# Patient Record
Sex: Male | Born: 1980 | Hispanic: Yes | Smoking: Never smoker
Health system: Southern US, Community
[De-identification: ages and names within clinical notes are randomized; demographics above are authoritative.]

## PROBLEM LIST (undated history)

## (undated) DIAGNOSIS — E7800 Pure hypercholesterolemia, unspecified: Secondary | ICD-10-CM

## (undated) DIAGNOSIS — R202 Paresthesia of skin: Secondary | ICD-10-CM

## (undated) DIAGNOSIS — R200 Anesthesia of skin: Secondary | ICD-10-CM

## (undated) HISTORY — DX: Anesthesia of skin: R20.0

## (undated) HISTORY — DX: Pure hypercholesterolemia, unspecified: E78.00

## (undated) HISTORY — DX: Paresthesia of skin: R20.2

## (undated) HISTORY — PX: OTHER SURGICAL HISTORY: SHX169

---

## 2014-03-29 ENCOUNTER — Ambulatory Visit
Admission: RE | Admit: 2014-03-29 | Discharge: 2014-03-29 | Disposition: A | Discharge status: Home / Self Care | Payer: No Typology Code available for payment source | Source: Ambulatory Visit | Attending: General Practice | Admitting: General Practice

## 2014-03-29 ENCOUNTER — Other Ambulatory Visit: Payer: Self-pay | Admitting: General Practice

## 2014-03-29 DIAGNOSIS — R609 Edema, unspecified: Secondary | ICD-10-CM

## 2015-03-30 IMAGING — US US EXTREM LOW VENOUS*R*
1 series · 13 of 24 positions shown · non-contrast
Comparison: None.

CLINICAL DATA: Right lower extremity edema.



[Series 1: us extrem low venous*right* · 13 of 31 slices shown]
[im 1/31]
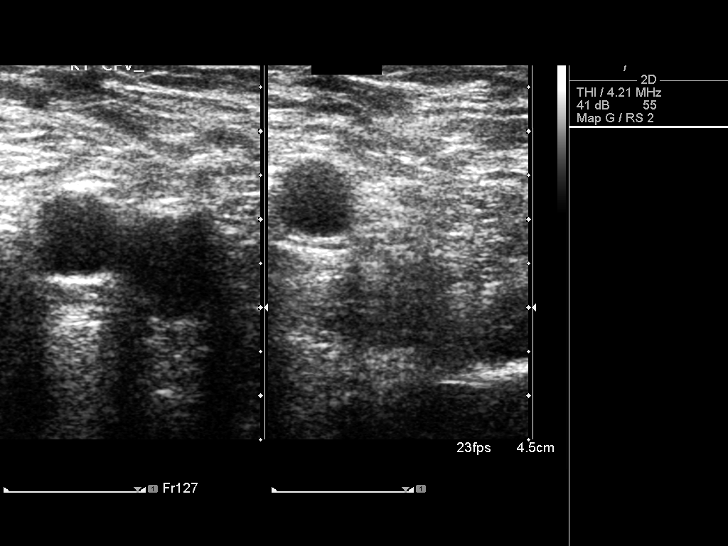
[im 3/31]
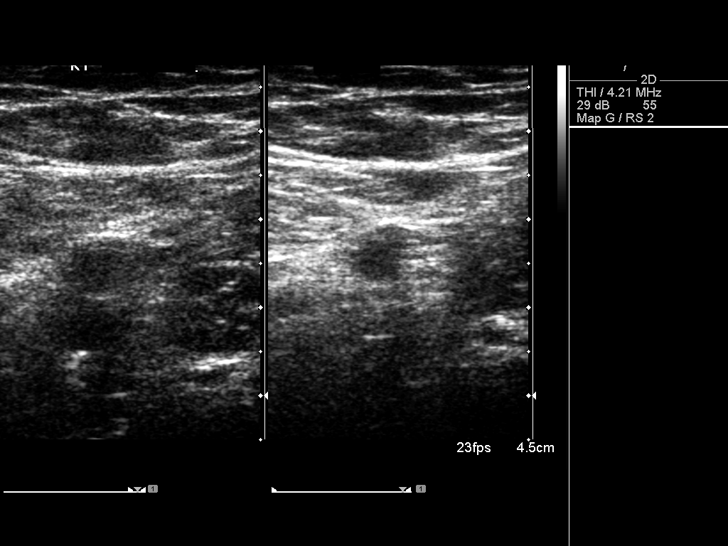
[im 6/31]
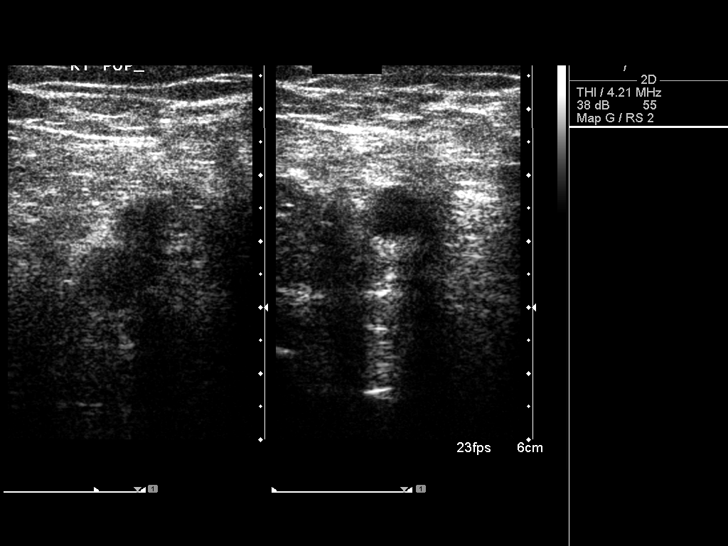
[im 8/31]
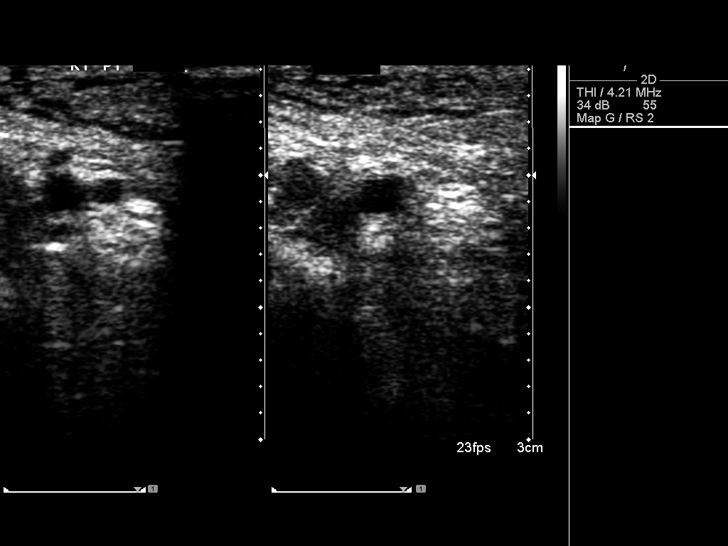
[im 11/31]
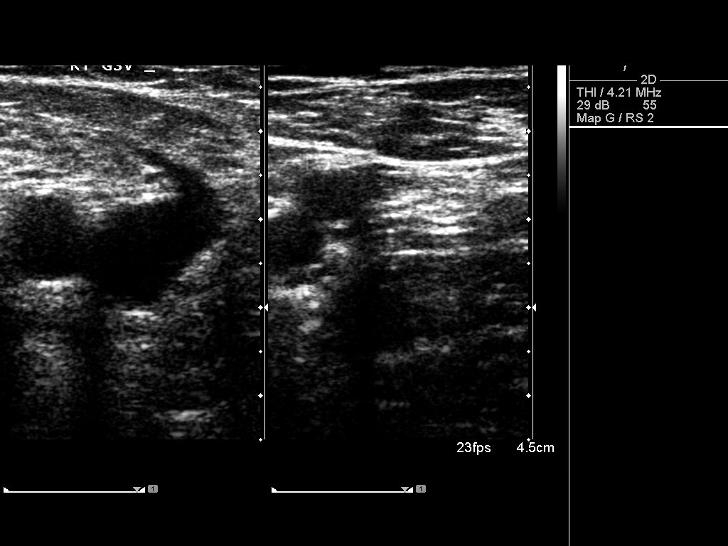
[im 14/31]
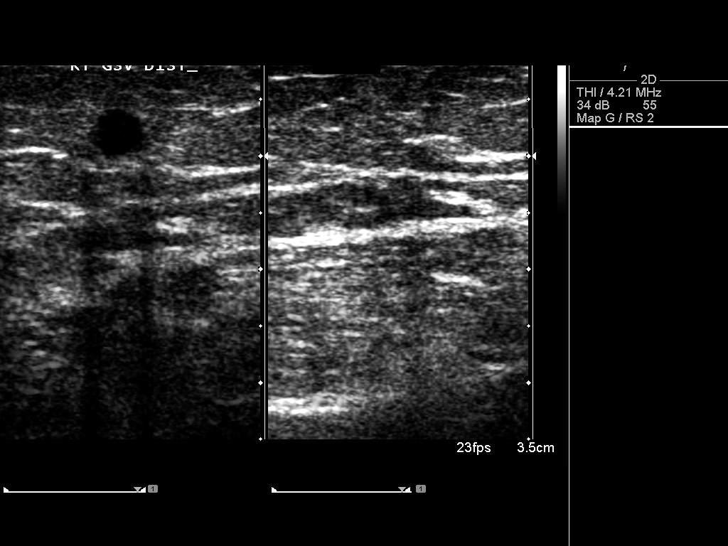
[im 16/31]
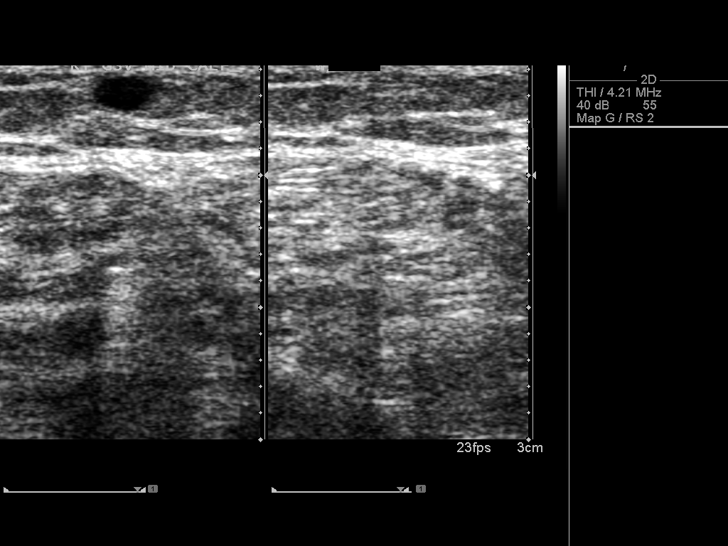
[im 17/31]
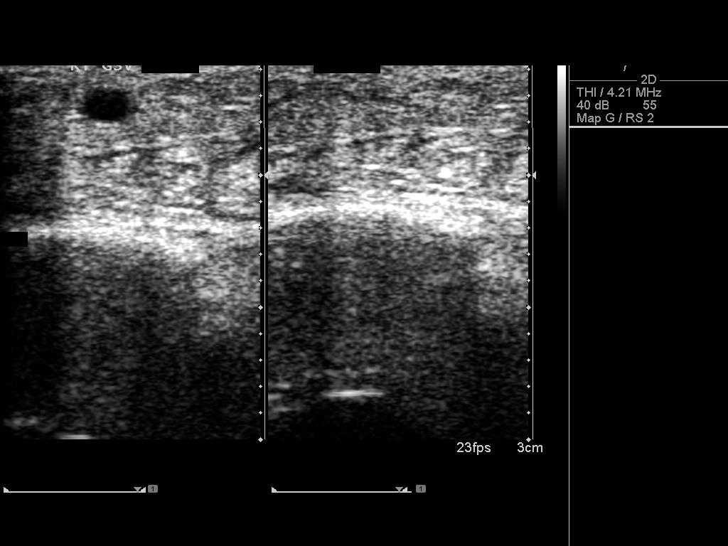
[im 20/31]
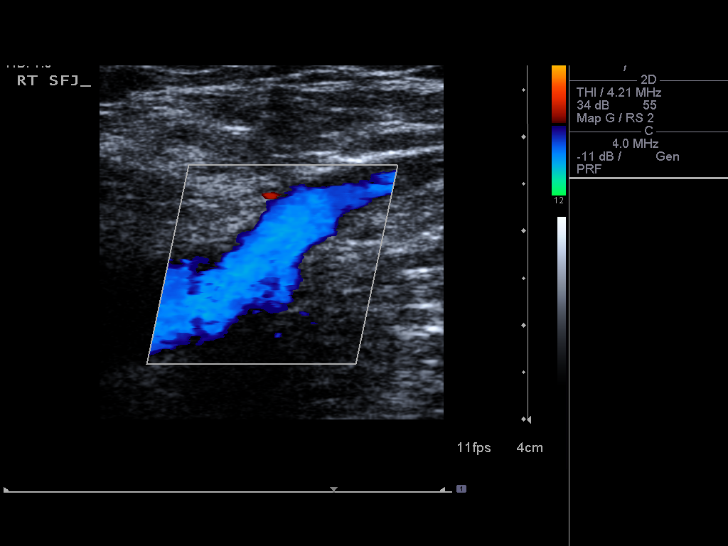
[im 23/31]
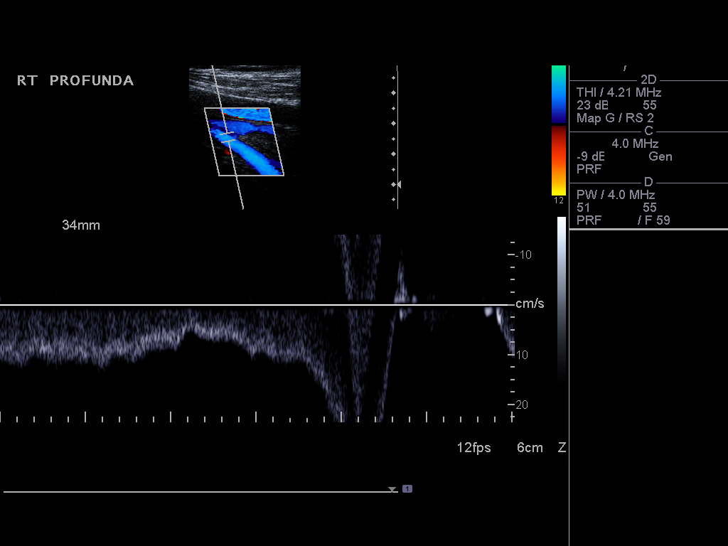
[im 25/31]
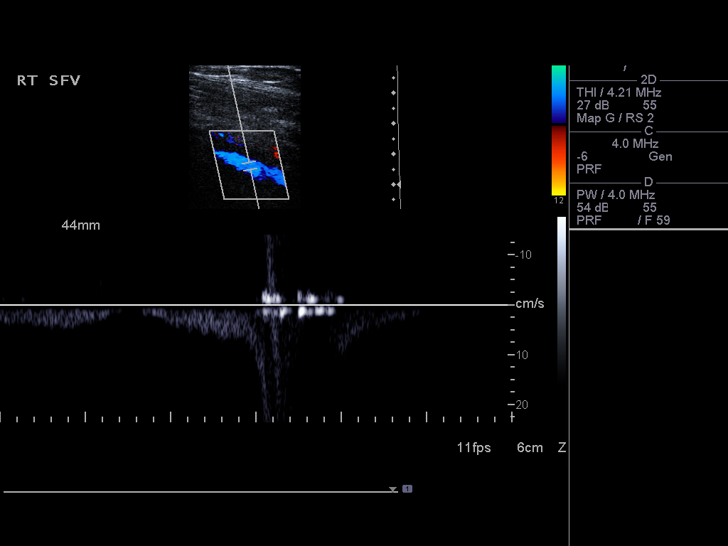
[im 28/31]
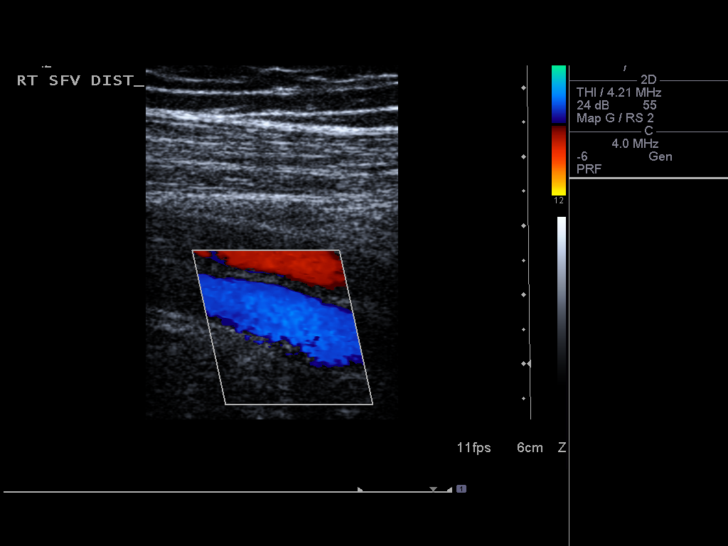
[im 31/31]
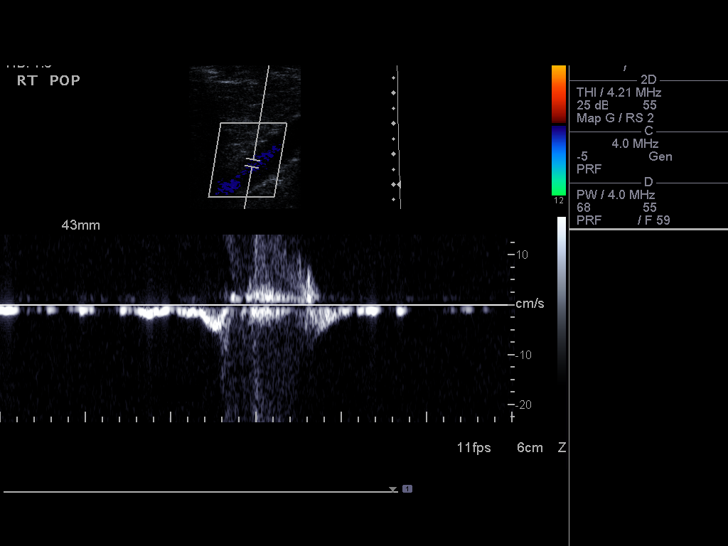

[13 of 24 positions shown; findings below may reference images not displayed]

FINDINGS: Common Femoral Vein: No evidence of thrombus. Normal
compressibility, respiratory phasicity and response to augmentation.

Saphenofemoral Junction: No evidence of thrombus. Normal
compressibility and flow on color Doppler imaging.

Profunda Femoral Vein: No evidence of thrombus. Normal
compressibility and flow on color Doppler imaging.

Femoral Vein: No evidence of thrombus. Normal compressibility,
respiratory phasicity and response to augmentation.

Popliteal Vein: No evidence of thrombus. Normal compressibility,
respiratory phasicity and response to augmentation.

Calf Veins: No evidence of thrombus. Normal compressibility and flow
on color Doppler imaging.

Superficial Great Saphenous Vein: No evidence of thrombus. Normal
compressibility and flow on color Doppler imaging.

Venous Reflux:  None.

Other Findings: No evidence of superficial thrombophlebitis or
abnormal fluid collection.
IMPRESSION: No evidence of right lower extremity deep venous thrombosis.

## 2018-10-21 ENCOUNTER — Ambulatory Visit: Payer: 59 | Admitting: Neurology

## 2018-12-09 ENCOUNTER — Ambulatory Visit: Payer: 59 | Admitting: Neurology

## 2019-01-07 ENCOUNTER — Ambulatory Visit: Payer: Self-pay | Admitting: Neurology

## 2019-02-02 ENCOUNTER — Other Ambulatory Visit: Payer: Self-pay

## 2019-02-02 ENCOUNTER — Ambulatory Visit: Payer: 59 | Admitting: Neurology

## 2019-02-02 ENCOUNTER — Telehealth: Payer: Self-pay | Admitting: Neurology

## 2019-02-02 ENCOUNTER — Encounter: Payer: Self-pay | Admitting: Neurology

## 2019-02-02 VITALS — BP 125/89 | HR 72 | Temp 98.6°F | Ht 74.0 in | Wt 240.5 lb

## 2019-02-02 DIAGNOSIS — R202 Paresthesia of skin: Secondary | ICD-10-CM

## 2019-02-02 DIAGNOSIS — M542 Cervicalgia: Secondary | ICD-10-CM | POA: Diagnosis not present

## 2019-02-02 MED ORDER — NORTRIPTYLINE HCL 25 MG PO CAPS: 50.0000 mg | ORAL_CAPSULE | Freq: Every day | ORAL | 11 refills | Status: AC

## 2019-02-02 NOTE — Progress Notes (Signed)
PATIENT: Nicholas Fitzgerald DOB: 12-23-1980  Chief Complaint  Patient presents with  . Numbness    Reports numbness/tingling in fingers and toes.  He has never tried medications for his symptoms.    . Cervical Discomfort    For the last 1-2 months, he feels like his muscles are being pulled.  He has pain radiating into his head.  He has additional discomfort when turning his head side to side.   Marland Kitchen PCP    Kendall Flack, MD     HISTORICAL  Nicholas Fitzgerald is a 38 years old male, seen in request by his primary care physician Dr. Dorena Cookey, Rosemarie Ax for evaluation of bilateral upper and lower extremity paresthesia, neck pain, initial evaluation was on February 02, 2019, he is Hispanic speaking, history is through interpreter.  I have reviewed and summarized the referring note from the referring physician.  He was previously healthy, worked as a Building surveyor, in 2019, without clear trigger, he began to notice bilateral fingertips and toes paresthesia, involving both side, there is no limitation in his daily activity, but gradually getting worse, he noticed it 50% of his awake time, he denies gait abnormality, no bowel and bladder incontinence, he also noticed the neck pain, radiating to bilateral shoulder and along his spine, he complains of fatigue, difficulty sleep, stressed   REVIEW OF SYSTEMS: Full 14 system review of systems performed and notable only for as above All other review of systems were negative.  ALLERGIES: No Known Allergies  HOME MEDICATIONS: No current outpatient medications on file.   No current facility-administered medications for this visit.     PAST MEDICAL HISTORY: Past Medical History:  Diagnosis Date  . High cholesterol   . Numbness and tingling    fingers and toes    PAST SURGICAL HISTORY: Past Surgical History:  Procedure Laterality Date  . no past surgery      FAMILY HISTORY: Family History  Problem Relation Age of Onset  . Healthy  Mother   . Pneumonia Father     SOCIAL HISTORY: Social History   Socioeconomic History  . Marital status: Legally Separated    Spouse name: Not on file  . Number of children: 2  . Years of education: 6th grade  . Highest education level: Not on file  Occupational History  . Occupation: Charity fundraiser  . Financial resource strain: Not on file  . Food insecurity    Worry: Not on file    Inability: Not on file  . Transportation needs    Medical: Not on file    Non-medical: Not on file  Tobacco Use  . Smoking status: Never Smoker  . Smokeless tobacco: Never Used  Substance and Sexual Activity  . Alcohol use: Yes    Comment: socially  . Drug use: Never  . Sexual activity: Not on file  Lifestyle  . Physical activity    Days per week: Not on file    Minutes per session: Not on file  . Stress: Not on file  Relationships  . Social Herbalist on phone: Not on file    Gets together: Not on file    Attends religious service: Not on file    Active member of club or organization: Not on file    Attends meetings of clubs or organizations: Not on file    Relationship status: Not on file  . Intimate partner violence    Fear of current or  ex partner: Not on file    Emotionally abused: Not on file    Physically abused: Not on file    Forced sexual activity: Not on file  Other Topics Concern  . Not on file  Social History Narrative   Lives at home with a roommate.   Right-handed.   Occasional coffee.          PHYSICAL EXAM   Vitals:   02/02/19 0746  BP: 125/89  Pulse: 72  Temp: 98.6 F (37 C)  Weight: 240 lb 8 oz (109.1 kg)  Height: 6\' 2"  (1.88 m)    Not recorded      Body mass index is 30.88 kg/m.  PHYSICAL EXAMNIATION:  Gen: NAD, conversant, well nourised, obese, well groomed                     Cardiovascular: Regular rate rhythm, no peripheral edema, warm, nontender. Eyes: Conjunctivae clear without exudates or hemorrhage Neck:  Supple, no carotid bruits. Pulmonary: Clear to auscultation bilaterally   NEUROLOGICAL EXAM:  MENTAL STATUS: Speech:    Speech is normal; fluent and spontaneous with normal comprehension.  Cognition:     Orientation to time, place and person     Normal recent and remote memory     Normal Attention span and concentration     Normal Language, naming, repeating,spontaneous speech     Fund of knowledge   CRANIAL NERVES: CN II: Visual fields are full to confrontation. Fundoscopic exam is normal with sharp discs and no vascular changes. Pupils are round equal and briskly reactive to light. CN III, IV, VI: extraocular movement are normal. No ptosis. CN V: Facial sensation is intact to pinprick in all 3 divisions bilaterally. Corneal responses are intact.  CN VII: Face is symmetric with normal eye closure and smile. CN VIII: Hearing is normal to rubbing fingers CN IX, X: Palate elevates symmetrically. Phonation is normal. CN XI: Head turning and shoulder shrug are intact CN XII: Tongue is midline with normal movements and no atrophy.  MOTOR: There is no pronator drift of out-stretched arms. Muscle bulk and tone are normal. Muscle strength is normal.  REFLEXES: Reflexes are 2+ and symmetric at the biceps, triceps, knees, and ankles. Plantar responses are flexor.  SENSORY: Intact to light touch, pinprick, positional sensation and vibratory sensation are intact in fingers and toes.  COORDINATION: Rapid alternating movements and fine finger movements are intact. There is no dysmetria on finger-to-nose and heel-knee-shin.    GAIT/STANCE: Posture is normal. Gait is steady with normal steps, base, arm swing, and turning. Heel and toe walking are normal. Tandem gait is normal.  Romberg is absent.   DIAGNOSTIC DATA (LABS, IMAGING, TESTING) - I reviewed patient records, labs, notes, testing and imaging myself where available.   ASSESSMENT AND PLAN  Nicholas Fitzgerald is a 38 y.o. male     Neck pain Bilateral upper and lower extremity paresthesia   MRI of cervical spine to rule out cervical spondylitic disease  EMG nerve conductions study to rule out peripheral neuropathy  Laboratory evaluations including A1c  Nortriptyline 25 mg titrating to 50 mg every night  Levert FeinsteinYijun Sokhna Christoph, M.D. Ph.D.  [facility] [address], [address] [place], [place] [address] Ph: [phone] Fax: [phone]  CC: Purvis SheffieldMercado, Claudia G, MD

## 2019-02-02 NOTE — Telephone Encounter (Signed)
[employer] order sent to GI. They will obtain the auth and reach out to the patient to schedule.  

## 2019-02-03 ENCOUNTER — Telehealth: Payer: Self-pay | Admitting: *Deleted

## 2019-02-03 LAB — COMPREHENSIVE METABOLIC PANEL
ALT: 32 IU/L (ref 0–44)
AST: 18 IU/L (ref 0–40)
Albumin/Globulin Ratio: 1.7 (ref 1.2–2.2)
Albumin: 4.7 g/dL (ref 4.0–5.0)
Alkaline Phosphatase: 67 IU/L (ref 39–117)
BUN/Creatinine Ratio: 9 (ref 9–20)
BUN: 10 mg/dL (ref 6–24)
Bilirubin Total: 0.4 mg/dL (ref 0.0–1.2)
CO2: 24 mmol/L (ref 20–29)
Calcium: 9.8 mg/dL (ref 8.7–10.2)
Chloride: 106 mmol/L (ref 96–106)
Creatinine, Ser: 1.16 mg/dL (ref 0.76–1.27)
GFR calc Af Amer: 91 mL/min/{1.73_m2} (ref >59)
GFR calc non Af Amer: 78 mL/min/{1.73_m2} (ref >59)
Globulin, Total: 2.7 g/dL (ref 1.5–4.5)
Glucose: 99 mg/dL (ref 65–99)
Potassium: 4.6 mmol/L (ref 3.5–5.2)
Sodium: 141 mmol/L (ref 134–144)
Total Protein: 7.4 g/dL (ref 6.0–8.5)

## 2019-02-03 LAB — CBC WITH DIFFERENTIAL
Basophils Absolute: 0 10*3/uL (ref 0.0–0.2)
Basos: 1 %
EOS (ABSOLUTE): 0.1 10*3/uL (ref 0.0–0.4)
Eos: 2 %
Hematocrit: 44 % (ref 37.5–51.0)
Hemoglobin: 15.5 g/dL (ref 13.0–17.7)
Immature Grans (Abs): 0 10*3/uL (ref 0.0–0.1)
Immature Granulocytes: 1 %
Lymphocytes Absolute: 2.1 10*3/uL (ref 0.7–3.1)
Lymphs: 36 %
MCH: 30.8 pg (ref 26.6–33.0)
MCHC: 35.2 g/dL (ref 31.5–35.7)
MCV: 87 fL (ref 79–97)
Monocytes Absolute: 0.4 10*3/uL (ref 0.1–0.9)
Monocytes: 6 %
Neutrophils Absolute: 3.2 10*3/uL (ref 1.4–7.0)
Neutrophils: 54 %
RBC: 5.04 x10E6/uL (ref 4.14–5.80)
RDW: 12.6 % (ref 11.6–15.4)
WBC: 5.8 10*3/uL (ref 3.4–10.8)

## 2019-02-03 LAB — VITAMIN B12: Vitamin B-12: 637 pg/mL (ref 232–1245)

## 2019-02-03 LAB — ANA W/REFLEX IF POSITIVE: Anti Nuclear Antibody (ANA): NEGATIVE

## 2019-02-03 LAB — HEMOGLOBIN A1C
Est. average glucose Bld gHb Est-mCnc: 114 mg/dL
Hgb A1c MFr Bld: 5.6 % (ref 4.8–5.6)

## 2019-02-03 LAB — TSH: TSH: 2.87 u[IU]/mL (ref 0.450–4.500)

## 2019-02-03 LAB — RPR: RPR Ser Ql: NONREACTIVE

## 2019-02-03 NOTE — Telephone Encounter (Signed)
-----   Message from Marcial Pacas, MD sent at 02/03/2019  2:42 PM EDT ----- Please call patient for normal laboratory result

## 2019-02-03 NOTE — Telephone Encounter (Signed)
I have called the patient by using the language line [phone]) and he has been notified of his lab results.

## 2019-03-04 ENCOUNTER — Other Ambulatory Visit: Payer: Self-pay

## 2019-03-08 NOTE — Telephone Encounter (Signed)
He is on schedule for EMG/NCS on August 24th 2020.  Please advise him to take over-counter Aleve as needed, if he remains significantly symptomatic, I can write gabapentin 300 mg 3 times a day for symptoms control, may even refer him to physical therapy,  His insurance will only approve MRI of cervical spine if failed above conservative treatment,

## 2019-03-08 NOTE — Telephone Encounter (Signed)
[employer] did not approve the MRI Cervical.  Denial Reason:   Based on [employer] Sp[employer] Imaging Guidelines Section: SP 3.1 Neck (Cervical Spine) Pain without and with Neurological Features (Including Stenosis), we cannot approve this request. Your records show that you have neck pain. The reason this request cannot be approved is because: 1. Guidelines may support imaging in the evaluation of suspected or known spinal disease with one of the following. -Failure to improve after a recent (within three months) six week trial of physician-guided clinical care with clinical re-evaluation. -Any signs or symptoms such as significant motor weakness, malignancy, infection, cauda equina syndrome, for which conservative treatment is not needed. The clinical information received fails to support meeting these requirements and, therefore, the request is not indicated at this time.   There is an option to do a peer to peer. The phone number is [phone] and the case number is [id number]. It does have to be scheduled in order to do the peer to peer. The deadline to do the peer to peer is Monday 03/22/19.. Right now he is scheduled at [facility] for Friday 03/12/19.

## 2019-03-09 NOTE — Telephone Encounter (Signed)
I called the patient by using the language line [phone]).  He is aware of the information below.  He would like to try Aleve and keep his NCV/EMG appt on 03/22/2019.  He may consider PT at that time.

## 2019-03-12 ENCOUNTER — Other Ambulatory Visit: Payer: Self-pay

## 2019-03-22 ENCOUNTER — Ambulatory Visit (INDEPENDENT_AMBULATORY_CARE_PROVIDER_SITE_OTHER): Payer: 59 | Admitting: Neurology

## 2019-03-22 ENCOUNTER — Other Ambulatory Visit: Payer: Self-pay

## 2019-03-22 ENCOUNTER — Encounter: Payer: 59 | Admitting: Neurology

## 2019-03-22 DIAGNOSIS — Z0289 Encounter for other administrative examinations: Secondary | ICD-10-CM

## 2019-03-22 DIAGNOSIS — R202 Paresthesia of skin: Secondary | ICD-10-CM | POA: Diagnosis not present

## 2019-03-22 DIAGNOSIS — M542 Cervicalgia: Secondary | ICD-10-CM | POA: Diagnosis not present

## 2019-03-22 NOTE — Procedures (Signed)
Full Name: Nicholas Fitzgerald Gender: Male MRN #: [id number] Date of Birth: Dec 13, 1980    Visit Date: 03/22/2019 13:33 Age: [age] Years 1 Months Old Examining Physician: Levert FeinsteinYijun Diona Peregoy, MD  Referring Physician: Levert FeinsteinYijun Walta [place], MD History: 38 year old male presented with intermittent bilateral upper and lower extremity paresthesia  Summary of the tests:  Nerve conduction study:  Bilateral sural, superficial peroneal sensory responses were normal.  Right median, ulnar sensory responses were normal.  Bilateral tibial, right peroneal to EDB, right median and ulnar motor responses were normal  Electromyography:  Selective needle examinations of bilateral lower extremity muscles showed evidence of widespread increased insertional activity, with mild chronic neuropathic changes involving bilateral L4-5 S1 myotomes.  There is no evidence of lumbar paraspinal muscle spontaneous discharge  Selected needle examination of the right upper extremity muscles were normal.   Conclusion:  This is a slight abnormal study.  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   ------------------------------- Levert FeinsteinYIjun Plato Alspaugh, M.D. PhD [facility] [address] [place], [place] [address] Tel: [phone] Fax: [phone]        [facility]    Nerve / Sites Muscle Latency Ref. Amplitude Ref. Rel Amp Segments Distance Velocity Ref. Area    ms ms mV mV %  cm m/s m/s mVms  R Median - APB     Wrist APB 3.9 ?4.4 7.6 ?4.0 100 Wrist - APB 7   27.8     Upper arm APB 8.2  8.0  105 Upper arm - Wrist 25 58 ?49 29.3  R Ulnar - ADM     Wrist ADM 2.4 ?3.3 9.9 ?6.0 100 Wrist - ADM 7   33.4     B.Elbow ADM 6.1  8.4  84.9 B.Elbow - Wrist 22 60 ?49 28.7     A.Elbow ADM 7.8  8.1  96.5 A.Elbow - B.Elbow 10 58 ?49 27.8         A.Elbow - Wrist      R Peroneal - EDB   Ankle EDB 5.5 ?6.5 5.0 ?2.0 100 Ankle - EDB 9   18.5     Fib head EDB 12.4  4.5  89.5 Fib head - Ankle 34 49 ?44 17.7     Pop fossa EDB 14.6  4.3  96.8 Pop fossa - Fib head 10 46 ?44 17.4         Pop fossa - Ankle      R Tibial - AH     Ankle AH 4.0 ?5.8 13.1 ?4.0 100 Ankle - AH 9   34.8     Pop fossa AH 13.1  10.8  82.2 Pop fossa - Ankle 40 44 ?41 30.6  L Tibial - AH     Ankle AH 4.4 ?5.8 9.5 ?4.0 100 Ankle - AH 9   29.6     Pop fossa AH 13.5  7.5  78 Pop fossa - Ankle 38 42 ?41 26.8               SNC    Nerve / Sites Rec. Site Peak Lat Ref.  Amp Ref. Segments Distance    ms ms V V  cm  R Sural - Ankle (Calf)     Calf Ankle 3.8 ?4.4 15 ?6 Calf - Ankle 14  L Sural - Ankle (Calf)     Calf Ankle 3.6 ?4.4 14 ?6 Calf -  Ankle 14  R Superficial peroneal - Ankle     Lat leg Ankle 4.0 ?4.4 7 ?6 Lat leg - Ankle 14  L Superficial peroneal - Ankle     Lat leg Ankle 4.2 ?4.4 9 ?6 Lat leg - Ankle 14  R Median - Orthodromic (Dig II, Mid palm)     Dig II Wrist 3.0 ?3.4 13 ?10 Dig II - Wrist 13  R Ulnar - Orthodromic, (Dig V, Mid palm)     Dig V Wrist 2.5 ?3.1 7 ?5 Dig V - Wrist 69                  F  Wave    Nerve F Lat Ref.   ms ms  R Tibial - AH 52.4 ?56.0  R Ulnar - ADM 28.6 ?32.0  L Tibial - AH 53.1 ?56.0           EMG       EMG Summary Table    Spontaneous MUAP Recruitment  Muscle IA Fib PSW Fasc Other Amp Dur. Poly Pattern  R. Tibialis anterior Increased None None None CRDs Normal Normal Normal Reduced  R. Tibialis posterior Increased None None None _______ Normal Normal Normal Reduced  R. Peroneus longus Increased None None None _______ Normal Normal Normal Reduced  R. Gastrocnemius (Medial head) Increased None None None _______ Normal Normal Normal Reduced  R. Vastus lateralis Increased None None None _______ Normal Normal Normal Reduced  R. Biceps femoris (short head) Increased None None None _______ Normal Normal Normal Reduced  R. Biceps femoris (long head) Increased  None None None _______ Normal Normal Normal Reduced  R. Lumbar paraspinals (mid) Normal None None None _______ Normal Normal Normal Normal  R. Lumbar paraspinals (low) Normal None None None _______ Normal Normal Normal Normal  L. Tibialis posterior Increased None None None _______ Normal Normal Normal Reduced  L. Tibialis anterior Increased None None None _______ Normal Normal Normal Reduced  L. Peroneus longus Increased None None None _______ Normal Normal Normal Reduced  L. Gastrocnemius (Medial head) Increased None None None _______ Normal Normal Normal Reduced  L. Vastus lateralis Increased None None None _______ Normal Normal Normal Reduced  L. Biceps femoris (short head) Normal None None None _______ Normal Normal Normal Normal  L. Lumbar paraspinals (mid) Normal None None None _______ Normal Normal Normal Normal  L. Lumbar paraspinals (low) Normal None None None _______ Normal Normal Normal Normal  R. First dorsal interosseous Normal None None None _______ Normal Normal Normal Normal  R. Pronator teres Normal None None None _______ Normal Normal Normal Normal  R. Biceps brachii Normal None None None _______ Normal Normal Normal Normal  R. Deltoid Normal None None None _______ Normal Normal Normal Normal  R. Triceps brachii Normal None None None _______ Normal Normal Normal Normal  R. Extensor digitorum communis Normal None None None _______ Normal Normal Normal Normal  R. Cervical paraspinals Normal None None None _______ Normal Normal Normal Normal

## 2019-03-23 ENCOUNTER — Telehealth: Payer: Self-pay | Admitting: Neurology

## 2019-03-23 NOTE — Telephone Encounter (Signed)
[employer] order sent to GI. They will obtain the auth and reach out to the patient to schedule.  

## 2019-06-21 ENCOUNTER — Telehealth: Payer: Self-pay | Admitting: *Deleted

## 2019-06-21 NOTE — Telephone Encounter (Signed)
I was unable to reach the patient.  I called the patient's friend on DPR, Nicholas Fitzgerald (through our interpreter services dept).  He had a pending appt on 06/28/2019 w/ Dr. Krista Blue but has not had his MRI yet.  Dr. Krista Blue requested the follow up be rescheduled until after his scan.  He was recently diagnosed with COVID-19 and has not been cleared by PCP yet.  He will call to schedule scan at [facility] and then our office to r/s follow up, once he is safe/recovered.  Per Dr. Krista Blue, it is okay for him to see NP.

## 2019-06-22 ENCOUNTER — Ambulatory Visit: Payer: Self-pay | Admitting: Neurology

## 2019-06-28 ENCOUNTER — Ambulatory Visit: Payer: Self-pay | Admitting: Neurology

## 2023-03-05 ENCOUNTER — Emergency Department (HOSPITAL_COMMUNITY): Payer: 59

## 2023-03-05 ENCOUNTER — Emergency Department (HOSPITAL_COMMUNITY)
Admission: EM | Admit: 2023-03-05 | Discharge: 2023-03-06 | Disposition: A | Discharge status: Home / Self Care | Payer: 59 | Attending: Emergency Medicine | Admitting: Emergency Medicine

## 2023-03-05 ENCOUNTER — Other Ambulatory Visit: Payer: Self-pay

## 2023-03-05 ENCOUNTER — Encounter (HOSPITAL_COMMUNITY): Payer: Self-pay | Admitting: Emergency Medicine

## 2023-03-05 DIAGNOSIS — A084 Viral intestinal infection, unspecified: Secondary | ICD-10-CM | POA: Insufficient documentation

## 2023-03-05 DIAGNOSIS — R197 Diarrhea, unspecified: Secondary | ICD-10-CM

## 2023-03-05 DIAGNOSIS — R1013 Epigastric pain: Secondary | ICD-10-CM | POA: Diagnosis present

## 2023-03-05 LAB — URINALYSIS, ROUTINE W REFLEX MICROSCOPIC
Bacteria, UA: NONE SEEN
Bilirubin Urine: NEGATIVE
Glucose, UA: NEGATIVE mg/dL
Hgb urine dipstick: NEGATIVE
Ketones, ur: NEGATIVE mg/dL
Leukocytes,Ua: NEGATIVE
Nitrite: NEGATIVE
Protein, ur: NEGATIVE mg/dL
Specific Gravity, Urine: 1.024 (ref 1.005–1.030)
pH: 5 (ref 5.0–8.0)

## 2023-03-05 LAB — CBC
HCT: 44.6 % (ref 39.0–52.0)
Hemoglobin: 14.9 g/dL (ref 13.0–17.0)
MCH: 29.7 pg (ref 26.0–34.0)
MCHC: 33.4 g/dL (ref 30.0–36.0)
MCV: 89 fL (ref 80.0–100.0)
Platelets: 293 10*3/uL (ref 150–400)
RBC: 5.01 MIL/uL (ref 4.22–5.81)
RDW: 12.6 % (ref 11.5–15.5)
WBC: 6.1 10*3/uL (ref 4.0–10.5)
nRBC: 0 % (ref 0.0–0.2)

## 2023-03-05 LAB — COMPREHENSIVE METABOLIC PANEL
ALT: 31 U/L (ref 0–44)
AST: 28 U/L (ref 15–41)
Albumin: 4.2 g/dL (ref 3.5–5.0)
Alkaline Phosphatase: 58 U/L (ref 38–126)
Anion gap: 8 (ref 5–15)
BUN: 13 mg/dL (ref 6–20)
CO2: 25 mmol/L (ref 22–32)
Calcium: 8.8 mg/dL — ABNORMAL LOW (ref 8.9–10.3)
Chloride: 105 mmol/L (ref 98–111)
Creatinine, Ser: 1.35 mg/dL — ABNORMAL HIGH (ref 0.61–1.24)
GFR, Estimated: >60 mL/min (ref >60)
Glucose, Bld: 91 mg/dL (ref 70–99)
Potassium: 3.8 mmol/L (ref 3.5–5.1)
Sodium: 138 mmol/L (ref 135–145)
Total Bilirubin: 0.8 mg/dL (ref 0.3–1.2)
Total Protein: 7.2 g/dL (ref 6.5–8.1)

## 2023-03-05 LAB — LIPASE, BLOOD: Lipase: 36 U/L (ref 11–51)

## 2023-03-05 MED ORDER — OMEPRAZOLE 20 MG PO CPDR: 20.0000 mg | DELAYED_RELEASE_CAPSULE | Freq: Every day | ORAL | 0 refills | Status: AC

## 2023-03-05 NOTE — Discharge Instructions (Addendum)
1.  You should be seen by a family doctor.  2. Follow instructions for diarrhea. 3. Collect a stool specimen and return it to the [facility] lab. 4. Take omeprazole daily for the next 2 weeks. 5.  You have an area on your kidney that is likely a cyst.  You should follow-up and have this rechecked with your family doctor.  There is also a small area on the front of your liver that is probably a hemangioma, a cluster of blood vessels.  This usually is not a serious condition.  However it is important that these findings are monitored.  You have been given a resource guide that includes follow-up recommendations.

## 2023-03-05 NOTE — ED Triage Notes (Signed)
Pt arrived POV with reports of diarrhea and abdominal pain x 2 weeks after eating shrimp. Denies fevers.

## 2023-03-05 NOTE — ED Provider Notes (Signed)
[facility] AT [facility] Provider Note   CSN: [id number] Arrival date & time: 03/05/23  1726     History  Chief Complaint  Patient presents with   Abdominal Pain    Nicholas Fitzgerald is a 42 y.o. male.  HPI Patient reports diarrhea for about 2 weeks.  He ate ceviche about 2 weeks ago.  His wife ate a little bit of but not much.  He subsequently ended up having both vomiting and diarrhea.  Initial the diarrhea was very dark and frequent.  It ended up getting more clear and watery.  He reports he thought things were getting better temporarily but then symptoms rebounded again.  He has been having persistent waxing and waning severity epigastric pain.  Worse when he went back to a regular diet the pain and diarrhea worsened again.  He denies fevers or chills.  Not seeing any blood in the emesis or stool.  Although stool was very dark initially.  Denies other medical problems.  He does not drink alcohol.  No frequent use of NSAIDs.  He was seen in urgent care and treated with ciprofloxacin but reports he had no improvement.    Home Medications Prior to Admission medications   Medication Sig Start Date End Date Taking? Authorizing Provider  omeprazole (PRILOSEC) 20 MG capsule Take 1 capsule (20 mg total) by mouth daily. 03/05/23  Yes Arby Barrette, MD  nortriptyline (PAMELOR) 25 MG capsule Take 2 capsules (50 mg total) by mouth at bedtime. 02/02/19   Levert Feinstein, MD      Allergies    Patient has no known allergies.    Review of Systems   Review of Systems  Physical Exam Updated Vital Signs BP 101/77 (BP Location: Left Arm)   Pulse 62   Temp 97.9 F (36.6 C) (Oral)   Resp 18   SpO2 97%  Physical Exam Constitutional:      Comments: Well-nourished well-developed.  Nontoxic.  No respiratory distress.  HENT:     Head: Normocephalic and atraumatic.     Mouth/Throat:     Pharynx: Oropharynx is clear.  Eyes:     Extraocular Movements: Extraocular  movements intact.  Cardiovascular:     Rate and Rhythm: Normal rate and regular rhythm.  Pulmonary:     Effort: Pulmonary effort is normal.     Breath sounds: Normal breath sounds.  Abdominal:     Comments: Moderate epigastric pain to palpation.  No guarding.  Lower abdomen nontender.  Musculoskeletal:        General: No swelling or tenderness. Normal range of motion.     Right lower leg: No edema.     Left lower leg: No edema.  Skin:    General: Skin is warm and dry.  Neurological:     General: No focal deficit present.     Mental Status: He is oriented to person, place, and time.     Coordination: Coordination normal.  Psychiatric:        Mood and Affect: Mood normal.     ED Results / Procedures / Treatments   Labs (all labs ordered are listed, but only abnormal results are displayed) Labs Reviewed  COMPREHENSIVE METABOLIC PANEL - Abnormal; Notable for the following components:      Result Value   Creatinine, Ser 1.35 (*)    Calcium 8.8 (*)    All other components within normal limits  LIPASE, BLOOD  CBC  URINALYSIS, ROUTINE W REFLEX MICROSCOPIC  EKG None  Radiology CT ABDOMEN PELVIS WO CONTRAST  Result Date: 03/05/2023 CLINICAL DATA:  Abdominal pain. EXAM: CT ABDOMEN AND PELVIS WITHOUT CONTRAST TECHNIQUE: Multidetector CT imaging of the abdomen and pelvis was performed following the standard protocol without IV contrast. RADIATION DOSE REDUCTION: This exam was performed according to the departmental dose-optimization program which includes automated exposure control, adjustment of the mA and/or kV according to patient size and/or use of iterative reconstruction technique. COMPARISON:  None Available. FINDINGS: Lower chest: Mild atelectasis is seen within the bilateral lung bases. Hepatobiliary: An ill-defined 15 mm x 8 mm x 12 mm focus of parenchymal low attenuation (approximately 5.13 Hounsfield units) is seen within the anterior aspect of the right lobe of the liver.  No gallstones, gallbladder wall thickening, or biliary dilatation. Pancreas: Unremarkable. No pancreatic ductal dilatation or surrounding inflammatory changes. Spleen: Normal in size without focal abnormality. Adrenals/Urinary Tract: Adrenal glands are unremarkable. Kidneys are normal in size, without renal calculi or hydronephrosis. A 4.9 cm diameter cyst is seen within the mid to upper left kidney. A 4 mm mildly hyperdense focus is seen within the posterior aspect of the mid right kidney. Bladder is unremarkable. Stomach/Bowel: Stomach is within normal limits. Appendix appears normal. No evidence of bowel wall thickening, distention, or inflammatory changes. Vascular/Lymphatic: No significant vascular findings are present. No enlarged abdominal or pelvic lymph nodes. Reproductive: Prostate is unremarkable. Other: No abdominal wall hernia or abnormality. No abdominopelvic ascites. Musculoskeletal: No acute or significant osseous findings. IMPRESSION: 1. 4.9 cm diameter left renal cyst. No follow-up imaging is recommended. This recommendation follows ACR consensus guidelines: Management of the Incidental Renal Mass on CT: A White Paper of the ACR Incidental Findings Committee. J Am Coll Radiol 2018;15:264-273. 2. 4 mm mildly hyperdense focus within the mid right kidney, which may represent a small hemorrhagic or proteinaceous cyst. Correlation with nonemergent renal ultrasound is recommended. 3. Findings which may represent a small hepatic hemangioma the anterior aspect of the right lobe of the liver. Correlation with nonemergent hepatic ultrasound is recommended. Electronically Signed   By: Aram Candela M.D.   On: 03/05/2023 23:19    Procedures Procedures    Medications Ordered in ED Medications - No data to display  ED Course/ Medical Decision Making/ A&P                                 Medical Decision Making Amount and/or Complexity of Data Reviewed Labs: ordered. Radiology:  ordered.  Risk Prescription drug management.   Patient has had problems with epigastric pain and diarrheal illness since 2 weeks ago.  He traces this back to having eaten cervichi.  This was 2 weeks ago.  Patient has not had fever or persistent vomiting.  Does have persistent epigastric pain.  Will proceed with CT abdomen pelvis to assess for any ongoing significant colitis or other etiology such as cholecystitis\appendicitis.  Metabolic panel normal except creatinine 1.35 normal LFTs normal GFR.  Urinalysis negative.  CBC normal.  Patient may have other infectious diarrheal illness.  At this point with 2 weeks of illness, CT scan does not show other etiology, will plan to discharge with stool collection kit.  CT scan shows a cyst of the kidney and hemangioma of the liver.  Not likely to have any relationship to patient's presenting symptoms.  Recommendations have been made for follow-up and monitoring.  Patient is in stable condition.  At this time we  will plan for discharge with omeprazole and GI collection kit.        Final Clinical Impression(s) / ED Diagnoses Final diagnoses:  Epigastric pain  Diarrhea of presumed infectious origin    Rx / DC Orders ED Discharge Orders          Ordered    Gastrointestinal Panel by PCR , Stool        03/05/23 2256    C difficile Toxins A+B W/Rflx        03/05/23 2256    omeprazole (PRILOSEC) 20 MG capsule  Daily        03/05/23 2256              Arby Barrette, MD 03/05/23 2350

## 2024-04-01 ENCOUNTER — Encounter: Payer: Self-pay | Admitting: *Deleted

## 2024-04-05 ENCOUNTER — Ambulatory Visit: Admitting: Neurology

## 2024-06-15 ENCOUNTER — Ambulatory Visit: Admitting: Neurology

## 2024-06-15 ENCOUNTER — Encounter: Payer: Self-pay | Admitting: Neurology

## 2024-06-15 DIAGNOSIS — G609 Hereditary and idiopathic neuropathy, unspecified: Secondary | ICD-10-CM | POA: Insufficient documentation
